# Patient Record
Sex: Male | Born: 1995 | Race: Black or African American | Hispanic: No | Marital: Single | Smoking: Never smoker
Health system: Southern US, Community
[De-identification: ages and names within clinical notes are randomized; demographics above are authoritative.]

---
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
## 2021-11-29 ENCOUNTER — Other Ambulatory Visit: Payer: Self-pay

## 2021-11-29 ENCOUNTER — Emergency Department
Admission: EM | Admit: 2021-11-29 | Discharge: 2021-11-29 | Disposition: A | Discharge status: Home / Self Care | Payer: BLUE CROSS/BLUE SHIELD | Attending: Emergency Medicine | Admitting: Emergency Medicine

## 2021-11-29 DIAGNOSIS — J029 Acute pharyngitis, unspecified: Secondary | ICD-10-CM | POA: Diagnosis present

## 2021-11-29 DIAGNOSIS — R599 Enlarged lymph nodes, unspecified: Secondary | ICD-10-CM | POA: Insufficient documentation

## 2021-11-29 DIAGNOSIS — Z20822 Contact with and (suspected) exposure to covid-19: Secondary | ICD-10-CM | POA: Diagnosis not present

## 2021-11-29 LAB — RESP PANEL BY RT-PCR (FLU A&B, COVID) ARPGX2
Influenza A by PCR: NEGATIVE
Influenza B by PCR: NEGATIVE
SARS Coronavirus 2 by RT PCR: NEGATIVE

## 2021-11-29 LAB — GROUP A STREP BY PCR: Group A Strep by PCR: NOT DETECTED

## 2021-11-29 MED ORDER — DOXYCYCLINE MONOHYDRATE 100 MG PO TABS: 100.0000 mg | ORAL_TABLET | Freq: Two times a day (BID) | ORAL | 0 refills | Status: AC

## 2021-11-29 MED ORDER — DEXAMETHASONE 10 MG/ML FOR PEDIATRIC ORAL USE
10.0000 mg | Freq: Once | INTRAMUSCULAR | Status: AC
Start: 2021-11-29 — End: 2021-11-29
  Administered 2021-11-29: 10 mg via ORAL
  Filled 2021-11-29: qty 1

## 2021-11-29 MED ORDER — LIDOCAINE HCL (PF) 1 % IJ SOLN
5.0000 mL | Freq: Once | INTRAMUSCULAR | Status: AC
Start: 2021-11-29 — End: 2021-11-29
  Administered 2021-11-29: 5 mL
  Filled 2021-11-29: qty 5

## 2021-11-29 MED ORDER — CEFTRIAXONE SODIUM 1 G IJ SOLR
500.0000 mg | Freq: Once | INTRAMUSCULAR | Status: AC
  Administered 2021-11-29: 500 mg via INTRAMUSCULAR
  Filled 2021-11-29: qty 10

## 2021-11-29 MED ORDER — ACETAMINOPHEN 325 MG PO TABS
650.0000 mg | ORAL_TABLET | Freq: Once | ORAL | Status: AC | PRN
  Administered 2021-11-29: 650 mg via ORAL
  Filled 2021-11-29: qty 2

## 2021-11-29 MED ORDER — AMOXICILLIN 875 MG PO TABS: 875.0000 mg | ORAL_TABLET | Freq: Two times a day (BID) | ORAL | 0 refills | Status: AC

## 2021-11-29 NOTE — Discharge Instructions (Addendum)
Take amoxicillin twice daily for the next 7 days. Take doxycycline twice daily for the next 7 days.

## 2021-11-29 NOTE — ED Triage Notes (Signed)
Pt states he developed a sore throat after having a cold the past week. Pt states he is having body aches and fever. Swollen lymph nodes.

## 2021-11-29 NOTE — ED Provider Notes (Signed)
[facility] Provider Note  Patient Contact: 9:33 PM (approximate)   History   Sore Throat and Lymphadenopathy   HPI  Jonathon Williams is a 26 y.o. male presents to the emergency department with fever for the past 2 days and pharyngitis for the past 40.  Patient has been able to drink and manage his own secretions but states it is difficult.  Patient denies nasal congestion, nonproductive cough, headache or rhinorrhea.  He does endorse body aches.  States that he has had recent unprotected oral sex and he is unsure of penile discharge.      Physical Exam   Triage Vital Signs: ED Triage Vitals  Enc Vitals Group     BP 11/29/21 2025 126/76     Pulse Rate 11/29/21 2025 (!) 103     Resp 11/29/21 2025 18     Temp 11/29/21 2025 (!) 102.5 F (39.2 C)     Temp Source 11/29/21 2025 Oral     SpO2 11/29/21 2025 96 %     Weight 11/29/21 2026 140 lb (63.5 kg)     Height --      Head Circumference --      Peak Flow --      Pain Score --      Pain Loc --      Pain Edu? --      Excl. in GC? --     Most recent vital signs: Vitals:   11/29/21 2025 11/29/21 2025  BP:  126/76  Pulse:  (!) 103  Resp: 18   Temp:  (!) 102.5 F (39.2 C)  SpO2:  96%     General: Alert and in no acute distress. Eyes:  PERRL. EOMI. Head: No acute traumatic findings ENT:      Ears:       Nose: No congestion/rhinnorhea.      Mouth/Throat: Mucous membranes are moist.  Posterior pharynx is erythematous.  Uvula is midline with tonsillar exudate bilaterally.  Patient is able to speak in complete sentences. Neck: No stridor. No cervical spine tenderness to palpation. Cardiovascular:  Good peripheral perfusion Respiratory: Normal respiratory effort without tachypnea or retractions. Lungs CTAB. Good air entry to the bases with no decreased or absent breath sounds. Gastrointestinal: Bowel sounds 4 quadrants. Soft and nontender to palpation. No guarding or rigidity. No palpable masses.  No distention. No CVA tenderness. Musculoskeletal: Full range of motion to all extremities.  Neurologic:  No gross focal neurologic deficits are appreciated.  Skin:   No rash noted Other:   ED Results / Procedures / Treatments   Labs (all labs ordered are listed, but only abnormal results are displayed) Labs Reviewed  GROUP A STREP BY PCR  RESP PANEL BY RT-PCR (FLU A&B, COVID) [id number]      PROCEDURES:  Critical Care performed: No  Procedures   MEDICATIONS ORDERED IN ED: Medications  cefTRIAXone (ROCEPHIN) injection 500 mg (has no administration in time range)  dexamethasone (DECADRON) 10 MG/ML injection for Pediatric ORAL use 10 mg (has no administration in time range)  acetaminophen (TYLENOL) tablet 650 mg (650 mg Oral Given 11/29/21 2031)     IMPRESSION / MDM / ASSESSMENT AND PLAN / ED COURSE  I reviewed the triage vital signs and the nursing notes.                              Differential diagnosis includes, but is not limited to, group  A strep, COVID-19, influenza, gonorrhea and chlamydia of the throat...  Assessment and plan Fever Pharyngitis 26 year old male presents to the emergency department with fever and pharyngitis for the past 4 days.  Patient was febrile and mildly tachycardic at triage.  On physical exam, patient was alert, active and nontoxic-appearing.  He had no increased work of breathing was able to manage his own secretions.  Tonsils appeared symmetric with tonsillar exudate and hypertrophy.  Given recent unprotected oral sex, will cover patient both for gonorrhea and chlamydia of the throat and group A strep with an injection of Rocephin in the emergency department and doxycycline and amoxicillin at discharge.  Patient was cautioned that if his symptoms do not start improving in the next 3 to 4 days he needs to return for reevaluation.  He voiced understanding and has easy access to the emergency department.      FINAL CLINICAL IMPRESSION(S) /  ED DIAGNOSES   Final diagnoses:  Pharyngitis, unspecified etiology     Rx / DC Orders   ED Discharge Orders          Ordered    doxycycline (ADOXA) 100 MG tablet  2 times daily        11/29/21 2130    amoxicillin (AMOXIL) 875 MG tablet  2 times daily        11/29/21 2130             Note:  This document was prepared using Dragon voice recognition software and may include unintentional dictation errors.   Pia Mau Greenwood, Cordelia Poche 11/29/21 2139    Georga Hacking, MD 11/29/21 2214

## 2022-04-10 ENCOUNTER — Ambulatory Visit: Payer: Self-pay | Admitting: Nurse Practitioner

## 2022-04-10 ENCOUNTER — Encounter: Payer: Self-pay | Admitting: Nurse Practitioner

## 2022-04-10 DIAGNOSIS — Z113 Encounter for screening for infections with a predominantly sexual mode of transmission: Secondary | ICD-10-CM

## 2022-04-10 LAB — HM HEPATITIS C SCREENING LAB: HM Hepatitis Screen: NEGATIVE

## 2022-04-10 LAB — HM HIV SCREENING LAB: HM HIV Screening: NEGATIVE

## 2022-04-10 LAB — HEPATITIS B SURFACE ANTIGEN: Hepatitis B Surface Ag: NONREACTIVE

## 2022-04-10 NOTE — Progress Notes (Signed)
[facility] Department STI clinic/screening visit  Subjective:  Jonathon Williams is a 26 y.o. male being seen today for an STI screening visit. The patient reports they do have symptoms.    Patient has the following medical conditions:  There are no problems to display for this patient.    Chief Complaint  Patient presents with   SEXUALLY TRANSMITTED DISEASE    HPI  Patient reports to clinic today for STD screening.  Patient reports discharge since 11/2021. Patient reports being diagnosed and treated in February for Gonorrhea.    Does the patient or their partner desires a pregnancy in the next year? No  Screening for MPX risk: Does the patient have an unexplained rash? No Is the patient MSM? No Does the patient endorse multiple sex partners or anonymous sex partners? No Did the patient have close or sexual contact with a person diagnosed with MPX? No Has the patient traveled outside the [place] where MPX is endemic? No Is there a high clinical suspicion for MPX-- evidenced by one of the following No  -Unlikely to be chickenpox  -Lymphadenopathy  -Rash that present in same phase of evolution on any given body part   See flowsheet for further details and programmatic requirements.   Immunization History  Administered Date(s) Administered   Hepatitis B 10/11/1996, 11/26/1996, 03/21/1998   Tdap 11/14/2010     The following portions of the patient's history were reviewed and updated as appropriate: allergies, current medications, past medical history, past social history, past surgical history and problem list.  Objective:  There were no vitals filed for this visit.  Physical Exam Constitutional:      Appearance: Normal appearance.  HENT:     Head: Normocephalic. No abrasion, masses or laceration. Hair is normal.     Mouth/Throat:     Mouth: No oral lesions.     Dentition: Dental caries present.     Pharynx: No pharyngeal swelling, oropharyngeal exudate,  posterior oropharyngeal erythema or uvula swelling.     Tonsils: No tonsillar exudate or tonsillar abscesses.     Comments: Poor dentition  Eyes:     General: Lids are normal.        Right eye: No discharge.        Left eye: No discharge.     Conjunctiva/sclera: Conjunctivae normal.     Right eye: No exudate.    Left eye: No exudate. Abdominal:     General: Abdomen is flat.     Palpations: Abdomen is soft.     Tenderness: There is no abdominal tenderness. There is no rebound.  Genitourinary:    Pubic Area: No rash or pubic lice.      Penis: Normal and circumcised. No erythema or discharge.      Testes: Normal.        Right: Mass or tenderness not present.        Left: Mass or tenderness not present.     Rectum: Normal.     Comments: Discharge amount: small Color: clear Musculoskeletal:     Cervical back: Full passive range of motion without pain, normal range of motion and neck supple.  Lymphadenopathy:     Cervical: No cervical adenopathy.     Right cervical: No superficial, deep or posterior cervical adenopathy.    Left cervical: No superficial, deep or posterior cervical adenopathy.     Upper Body:     Right upper body: No supraclavicular, axillary or epitrochlear adenopathy.     Left upper  body: No supraclavicular, axillary or epitrochlear adenopathy.     Lower Body: No right inguinal adenopathy. No left inguinal adenopathy.  Skin:    General: Skin is warm and dry.     Findings: No lesion or rash.  Neurological:     Mental Status: He is alert and oriented to person, place, and time.  Psychiatric:        Attention and Perception: Attention normal.        Mood and Affect: Mood normal.        Speech: Speech normal.        Behavior: Behavior normal. Behavior is cooperative.       Assessment and Plan:  Jonathon Williams is a 26 y.o. male presenting to the [facility] Department for STI screening  1. Screening examination for venereal disease -26 year old  male in clinic for STD screening. -Patient does have STI symptoms Patient accepted all screenings including  oral GC,  urine CT/GC, gram stain,  and bloodwork for HIV/RPR.  Patient meets criteria for HepB screening? Yes. Ordered? Yes Patient meets criteria for HepC screening? Yes. Ordered? Yes Recommended condom use with all sex Discussed importance of condom use for STI prevent  Treat gram stain per standing order Discussed time line for [facility] results and that patient will be called with positive results and encouraged patient to call if he had not heard in 2 weeks Recommended returning for continued or worsening symptoms.    - Gram stain - Gonococcus culture - Chlamydia/GC NAA, Confirmation - HIV/HCV [place] [facility] - Syphilis Serology, [facility] - HBV Antigen/Antibody [facility]     Return if symptoms worsen or fail to improve.   Glenna Fellows, FNP

## 2022-04-11 LAB — GRAM STAIN

## 2022-04-12 LAB — CHLAMYDIA/GC NAA, CONFIRMATION
Chlamydia trachomatis, NAA: NEGATIVE
Neisseria gonorrhoeae, NAA: NEGATIVE

## 2022-04-15 LAB — GONOCOCCUS CULTURE
